# Patient Record
Sex: Male | Born: 1960 | Race: White | Hispanic: No | Marital: Married | Smoking: Former smoker
Health system: Southern US, Community
[De-identification: ages and names within clinical notes are randomized; demographics above are authoritative.]

## PROBLEM LIST (undated history)

## (undated) DIAGNOSIS — F419 Anxiety disorder, unspecified: Secondary | ICD-10-CM

## (undated) DIAGNOSIS — E119 Type 2 diabetes mellitus without complications: Secondary | ICD-10-CM

## (undated) DIAGNOSIS — G4730 Sleep apnea, unspecified: Secondary | ICD-10-CM

## (undated) DIAGNOSIS — F32A Depression, unspecified: Secondary | ICD-10-CM

## (undated) DIAGNOSIS — E7800 Pure hypercholesterolemia, unspecified: Secondary | ICD-10-CM

## (undated) DIAGNOSIS — F329 Major depressive disorder, single episode, unspecified: Secondary | ICD-10-CM

## (undated) DIAGNOSIS — E669 Obesity, unspecified: Secondary | ICD-10-CM

## (undated) HISTORY — DX: Anxiety disorder, unspecified: F41.9

## (undated) HISTORY — DX: Type 2 diabetes mellitus without complications: E11.9

## (undated) HISTORY — DX: Obesity, unspecified: E66.9

## (undated) HISTORY — DX: Depression, unspecified: F32.A

## (undated) HISTORY — DX: Pure hypercholesterolemia, unspecified: E78.00

## (undated) HISTORY — DX: Major depressive disorder, single episode, unspecified: F32.9

## (undated) HISTORY — PX: NOSE SURGERY: SHX723

## (undated) HISTORY — DX: Sleep apnea, unspecified: G47.30

---

## 1999-02-04 ENCOUNTER — Ambulatory Visit: Admission: RE | Admit: 1999-02-04 | Discharge: 1999-02-04 | Payer: Self-pay | Admitting: *Deleted

## 1999-05-05 ENCOUNTER — Encounter: Payer: Self-pay | Admitting: *Deleted

## 1999-05-06 ENCOUNTER — Ambulatory Visit (HOSPITAL_COMMUNITY): Admission: RE | Admit: 1999-05-06 | Discharge: 1999-05-07 | Payer: Self-pay | Admitting: *Deleted

## 2002-06-11 ENCOUNTER — Ambulatory Visit (HOSPITAL_COMMUNITY): Admission: RE | Admit: 2002-06-11 | Discharge: 2002-06-11 | Payer: Self-pay | Admitting: Nephrology

## 2002-06-11 ENCOUNTER — Encounter: Payer: Self-pay | Admitting: Nephrology

## 2002-06-25 ENCOUNTER — Encounter: Payer: Self-pay | Admitting: Nephrology

## 2002-06-25 ENCOUNTER — Ambulatory Visit (HOSPITAL_COMMUNITY): Admission: RE | Admit: 2002-06-25 | Discharge: 2002-06-25 | Payer: Self-pay | Admitting: Nephrology

## 2003-01-30 ENCOUNTER — Ambulatory Visit (HOSPITAL_BASED_OUTPATIENT_CLINIC_OR_DEPARTMENT_OTHER): Admission: RE | Admit: 2003-01-30 | Discharge: 2003-01-30 | Payer: Self-pay | Admitting: Pulmonary Disease

## 2017-10-02 ENCOUNTER — Encounter: Payer: Self-pay | Admitting: Gastroenterology

## 2017-10-08 ENCOUNTER — Ambulatory Visit (INDEPENDENT_AMBULATORY_CARE_PROVIDER_SITE_OTHER): Payer: Federal, State, Local not specified - PPO | Admitting: Gastroenterology

## 2017-10-08 ENCOUNTER — Encounter: Payer: Self-pay | Admitting: Gastroenterology

## 2017-10-08 ENCOUNTER — Other Ambulatory Visit (INDEPENDENT_AMBULATORY_CARE_PROVIDER_SITE_OTHER): Payer: Federal, State, Local not specified - PPO

## 2017-10-08 ENCOUNTER — Ambulatory Visit (HOSPITAL_BASED_OUTPATIENT_CLINIC_OR_DEPARTMENT_OTHER)
Admission: RE | Admit: 2017-10-08 | Discharge: 2017-10-08 | Disposition: A | Discharge status: Home / Self Care | Payer: Federal, State, Local not specified - PPO | Source: Ambulatory Visit | Attending: Gastroenterology | Admitting: Gastroenterology

## 2017-10-08 VITALS — BP 158/80 | HR 83 | Ht 72.0 in | Wt 264.1 lb

## 2017-10-08 DIAGNOSIS — Z1212 Encounter for screening for malignant neoplasm of rectum: Secondary | ICD-10-CM

## 2017-10-08 DIAGNOSIS — Z1211 Encounter for screening for malignant neoplasm of colon: Secondary | ICD-10-CM

## 2017-10-08 DIAGNOSIS — E118 Type 2 diabetes mellitus with unspecified complications: Secondary | ICD-10-CM | POA: Diagnosis present

## 2017-10-08 DIAGNOSIS — R160 Hepatomegaly, not elsewhere classified: Secondary | ICD-10-CM

## 2017-10-08 DIAGNOSIS — K760 Fatty (change of) liver, not elsewhere classified: Secondary | ICD-10-CM | POA: Diagnosis not present

## 2017-10-08 DIAGNOSIS — K7689 Other specified diseases of liver: Secondary | ICD-10-CM

## 2017-10-08 NOTE — Progress Notes (Signed)
Chief Complaint: Hepatomegaly, right sided abdominal pain  Referring Provider:  Kathrin Ruddy, PA-C    HPI:     Kenneth Rollins is a 57 y.o. male referred to the [facility] for evaluation of abnormal liver on recent imaging study.   CT in July at [place] notable for 1.6 cm right hepatic lobe cyst, moderate steatosis, hepatomegaly at 22.8 cm without masses or duct dilatation. Normal appearing pancreas and spleen. Additionally with a 10 mm perinephric nodule, with recommendation for follow-up CT in 6 months.   CT Chest in May 2019 notable for decreased hepatic attenuation and 1.6 cm cyst and a reactive 2 cm LN in the hepatoduodenal ligament  Eval at [place] also notable for normal WBC, Hgb/Hct and PLTs, negative, HBV, HCV, HAV, lipase, CMP (aside from BG 424) A1c 11.8 2017: Normal LAEs 2018: Normal LAEs   Today, he is c/o right sided abdominal pain and feels swelling on the right side and now increased pain and feeling of swelling on the LUQ as well. Pain increases within an hour of starting work/route.  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  He endorses some S OB but attributes this to right sided discomfort and perceived swelling.  He otherwise has no hx of known liver or biliary disease, and no jaundice, icteric sclera, asictes, confusion. No known family history of CRC, GI malignancy, liver disease, pancreatic disease, or IBD.  He does not drink alcohol.  Known history of previously elevated liver enzymes.  He was last seen by his PCM in May 2019 but admits to not following up regularly. At that time, was ordered for labs and CT with close f/u owing to his poorly controlled DM. He was prescribed Toujeo (he is unclear today about how often he is actually taking  insulin), and has not follow-up yet. He reports having home BG levels >700 this year, but does not report this to [facility] or go to hospital.   Past Medical History:  Diagnosis Date  . Anxiety   . Depression   . Diabetes ([place])   . High blood cholesterol   . Obesity   . Sleep apnea      Past Surgical History:  Procedure Laterality Date  . NOSE SURGERY     Family History  Problem Relation Age of Onset  . Diabetes Father   . Heart disease Father    Social History   Tobacco Use  . Smoking status: Former Research scientist (life sciences)  . Smokeless tobacco: Never Used  Substance Use Topics  . Alcohol use: Not Currently  . Drug use: Not Currently   Current Outpatient Medications  Medication Sig Dispense Refill  . amphetamine-dextroamphetamine (ADDERALL) 20 MG tablet Take 1 tablet by mouth 3 (three) times daily.    . clonazePAM (KLONOPIN) 1 MG tablet Take 1 tablet by mouth 2 (two) times daily.    . divalproex (DEPAKOTE ER) 500 MG 24 hr tablet Take 1 tablet by mouth daily.    Marland Kitchen escitalopram (LEXAPRO) 20 MG tablet Take 1 tablet by mouth 2 (two) times daily.    . insulin lispro (HUMALOG) 100 UNIT/ML KiwkPen Inject 100 mLs into the skin daily.    . Insulin Pen Needle (EXEL COMFORT POINT PEN  NEEDLE) 31G X 6 MM MISC 1 each by Misc.(Non-Drug; Combo Route) route 3 times daily.    Marland Kitchen lisinopril (PRINIVIL,ZESTRIL) 10 MG tablet Take 1 tablet by mouth daily.    . QUEtiapine (SEROQUEL) 100 MG tablet Take 1 tablet by mouth daily.  1   No current facility-administered medications for this visit.    Allergies  Allergen Reactions  . Metformin     Diarrhea     Review of Systems: All systems reviewed and negative except where noted in HPI.     Physical Exam:    Wt Readings from Last 3 Encounters:  10/08/17 264 lb 2 oz (119.8 kg)    BP (!) 158/80   Pulse 83   Ht 6' (1.829 m)   Wt 264 lb 2 oz (119.8 kg)   BMI 35.82 kg/m  Constitutional:  Pleasant, in no acute distress. Psychiatric: Normal mood and affect.  Behavior is normal. EENT: Pupils normal.  Conjunctivae are normal. No scleral icterus. Neck supple. No cervical LAD. Cardiovascular: Normal rate, regular rhythm. No edema Pulmonary/chest: Effort normal and breath sounds normal. No wheezing, rales or rhonchi. Abdominal: Soft, nondistended, nontender. Bowel sounds active throughout. There are no masses palpable. No hepatomegaly. Neurological: Alert and oriented to person place and time. Skin: Skin is warm and dry. No rashes noted.   ASSESSMENT AND PLAN;    1) Hepatomegaly: Hepatomegaly noted on recent CT of 22.8 cm.  The location of his pain and CT findings are certainly indicative of pain secondary to hepatomegaly.  We discussed the underlying etiologies for hepatomegaly, and his particular case (poorly controlled diabetes, obesity, poor diet) certainly seems consistent with fatty liver disease.  Interestingly he has had normal liver enzymes since 7341, although in certain situations this may actually portend a poor prognosis with already-progression from NAFLD to NASH.  Otherwise, normal albumin and normal platelets suggestive of preserved hepatic synthetic function, without any coag panel to date.   We did very long and frank discussion regarding his uncontrolled diabetes (recent A1c greater than 11) and the very real risk that he is already progressed from NAFLD to NASH, particularly with his normal enzymes.  Additional etiologies for hepatomegaly include viral hepatitis (HBV and HCV negative), autoimmune hepatitis, Wilson disease, hemochromatosis, alpha-1 antitrypsin deficiency, hepatic vein thrombosis, cardiac and will plan on extended evaluation as below.  - RUQ [place] with Doppler - INR check - AMA, ASMA, ANA - Consider liver bx to eval for NASH -If above eval is unrevealing, will do extended serologic evaluation to rule out other potential concomitant disease - Given the presumed diagnosis of fatty liver disease, I extensively counseled the  patient on the importance of diet and exercise, with a modest weight loss of 10% of total body weight, done slowly over weeks to months.  To be done in conjunction with diabetes management as outlined below - Pioglitazone (but not Metformin) has been efficacious in the treatment of NASH patient, although the majority of patients who received benefit from TZDs in clinical trials were non-diabetic, and long-term efficacy is not well established to date, and risks include cardiovascular disease, CHF, bladder cancer and bone loss.  - Vitamin E 800 IU/day improves liver histology in non-diabetic NASH patients, and can be considered as first line monotherapy in this population. However, due to the noted side effects and reduced studies on certain populations, this is generally not recommended in patients with DM, NASH cirrhosis, or cryptogenic cirrhosis    2) Hepatic cyst: CT with 1.6 cm  cyst in right hepatic lobe.  Also seen on previous CT from May 2019.  Appearance seems consistent with simple hepatic cyst, but given size greater than 1 cm will elect to survey for size stability in the future.  - RUQ [place] as above can also help to characterize size and etiology for cyst.  Can then plan on repeat RUQ [place] in 6 to 12 months to ensure size stability - Reassured patient that the small size cyst is not the etiology for his pain and no need for surgical intervention for this lesion at this time  3) CRC screening: No previous CRC screening.  We discussed colon cancer to include screening modalities such as optical colonoscopy, FIT kit testing, Cologuard, and he decided to proceed with colonoscopy.  The indications, risks, and benefits of colonoscopy were explained to the patient in detail. Risks include but are not limited to bleeding, perforation, adverse reaction to medications, and cardiopulmonary compromise. Sequelae include but are not limited to the possibility of surgery, hospitalization, and mortality. The  patient verbalized understanding and wished to proceed. All questions answered, referred to the scheduler and bowel prep ordered. Further recommendations pending results of the exam.   4) Diabetes:  Long history of poorly controlled diabetes with recent hemoglobin A1c of 11.8 He is unclear/vague today about how often he is actually even taking his insulin.  We had a very long and frank discussion regarding the risks of his poorly controlled diabetes, to include stroke, heart attack, PVD, neuropathy, renal disease.  I explained, in no uncertain terms, that without adequately treating his diabetes, that he will undoubtedly have continued clinical decline and eventual organ failure.  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  I gave a strong recommendation for him to call his [facility] office today and schedule a short interval follow-up for continued diabetes management.    Lavena Bullion, DO, FACG  10/08/2017, 8:48 AM   Cc: Kathrin Ruddy, PA-C Myrtis Hopping., MD

## 2017-10-08 NOTE — Patient Instructions (Addendum)
If you are age 58 or older, your body mass index should be between 23-30. Your Body mass index is 35.82 kg/m. If this is out of the aforementioned range listed, please consider follow up with your Primary Care Provider.  If you are age 58 or younger, your body mass index should be between 19-25. Your Body mass index is 35.82 kg/m. If this is out of the aformentioned range listed, please consider follow up with your Primary Care Provider.   You have been [facility]1st floor [address] ) on 10/08/2017 at 3:30pm. Please arrive 15 minutes prior to your appointment for registration. Make certain not to have anything to eat or drink 6 hours prior to your appointment. Should you need to reschedule your appointment, please contact radiology at [phone]. This test typically takes about 30 minutes to perform.  Please call us back at [phone] to schedule your Colonoscopy when you find an available date.  Please go to the lab on the 2nd floor [address] before you leave the office today.    It was a pleasure to see you today!  Vito Cirigliano, D.O.

## 2017-10-09 LAB — PROTIME-INR
INR: 1.1 ratio — ABNORMAL HIGH (ref 0.8–1.0)
Prothrombin Time: 12.8 s (ref 9.6–13.1)

## 2017-10-10 ENCOUNTER — Encounter: Payer: Self-pay | Admitting: Gastroenterology

## 2017-10-10 LAB — ANTI-SMOOTH MUSCLE ANTIBODY, IGG: Actin (Smooth Muscle) Antibody (IGG): <20 U (ref <20)

## 2017-10-10 LAB — ANA: Anti Nuclear Antibody(ANA): NEGATIVE

## 2017-10-10 LAB — MITOCHONDRIAL ANTIBODIES: Mitochondrial M2 Ab, IgG: <=20 U

## 2017-10-11 ENCOUNTER — Other Ambulatory Visit: Payer: Self-pay

## 2017-10-11 DIAGNOSIS — R935 Abnormal findings on diagnostic imaging of other abdominal regions, including retroperitoneum: Secondary | ICD-10-CM

## 2017-10-11 DIAGNOSIS — R945 Abnormal results of liver function studies: Secondary | ICD-10-CM

## 2017-10-11 DIAGNOSIS — K760 Fatty (change of) liver, not elsewhere classified: Secondary | ICD-10-CM

## 2017-10-11 DIAGNOSIS — K7689 Other specified diseases of liver: Secondary | ICD-10-CM

## 2017-10-11 DIAGNOSIS — R160 Hepatomegaly, not elsewhere classified: Secondary | ICD-10-CM

## 2017-10-11 DIAGNOSIS — R7989 Other specified abnormal findings of blood chemistry: Secondary | ICD-10-CM

## 2018-02-13 ENCOUNTER — Other Ambulatory Visit: Payer: Self-pay

## 2018-02-13 ENCOUNTER — Encounter (HOSPITAL_BASED_OUTPATIENT_CLINIC_OR_DEPARTMENT_OTHER): Payer: Self-pay | Admitting: Emergency Medicine

## 2018-02-13 ENCOUNTER — Emergency Department (HOSPITAL_BASED_OUTPATIENT_CLINIC_OR_DEPARTMENT_OTHER)
Admission: EM | Admit: 2018-02-13 | Discharge: 2018-02-13 | Disposition: A | Discharge status: Home / Self Care | Payer: Federal, State, Local not specified - PPO | Attending: Emergency Medicine | Admitting: Emergency Medicine

## 2018-02-13 DIAGNOSIS — K0889 Other specified disorders of teeth and supporting structures: Secondary | ICD-10-CM | POA: Insufficient documentation

## 2018-02-13 DIAGNOSIS — Z87891 Personal history of nicotine dependence: Secondary | ICD-10-CM | POA: Insufficient documentation

## 2018-02-13 DIAGNOSIS — F329 Major depressive disorder, single episode, unspecified: Secondary | ICD-10-CM | POA: Insufficient documentation

## 2018-02-13 DIAGNOSIS — R220 Localized swelling, mass and lump, head: Secondary | ICD-10-CM | POA: Insufficient documentation

## 2018-02-13 DIAGNOSIS — Z79899 Other long term (current) drug therapy: Secondary | ICD-10-CM | POA: Insufficient documentation

## 2018-02-13 DIAGNOSIS — E119 Type 2 diabetes mellitus without complications: Secondary | ICD-10-CM | POA: Diagnosis not present

## 2018-02-13 DIAGNOSIS — F419 Anxiety disorder, unspecified: Secondary | ICD-10-CM | POA: Insufficient documentation

## 2018-02-13 DIAGNOSIS — Z794 Long term (current) use of insulin: Secondary | ICD-10-CM | POA: Insufficient documentation

## 2018-02-13 LAB — BASIC METABOLIC PANEL
Anion gap: 8 (ref 5–15)
BUN: 11 mg/dL (ref 6–20)
CO2: 24 mmol/L (ref 22–32)
Calcium: 9.1 mg/dL (ref 8.9–10.3)
Chloride: 98 mmol/L (ref 98–111)
Creatinine, Ser: 0.74 mg/dL (ref 0.61–1.24)
GFR calc Af Amer: >60 mL/min (ref >60)
GFR calc non Af Amer: >60 mL/min (ref >60)
Glucose, Bld: 461 mg/dL — ABNORMAL HIGH (ref 70–99)
Potassium: 4.3 mmol/L (ref 3.5–5.1)
Sodium: 130 mmol/L — ABNORMAL LOW (ref 135–145)

## 2018-02-13 LAB — CBC WITH DIFFERENTIAL/PLATELET
Abs Immature Granulocytes: 0.05 10*3/uL (ref 0.00–0.07)
Basophils Absolute: 0.1 10*3/uL (ref 0.0–0.1)
Basophils Relative: 0 %
Eosinophils Absolute: 0.1 10*3/uL (ref 0.0–0.5)
Eosinophils Relative: 1 %
HCT: 42 % (ref 39.0–52.0)
Hemoglobin: 13.7 g/dL (ref 13.0–17.0)
Immature Granulocytes: 0 %
Lymphocytes Relative: 12 %
Lymphs Abs: 1.9 10*3/uL (ref 0.7–4.0)
MCH: 30 pg (ref 26.0–34.0)
MCHC: 32.6 g/dL (ref 30.0–36.0)
MCV: 92.1 fL (ref 80.0–100.0)
Monocytes Absolute: 1.3 10*3/uL — ABNORMAL HIGH (ref 0.1–1.0)
Monocytes Relative: 8 %
Neutro Abs: 13.2 10*3/uL — ABNORMAL HIGH (ref 1.7–7.7)
Neutrophils Relative %: 79 %
Platelets: 333 10*3/uL (ref 150–400)
RBC: 4.56 MIL/uL (ref 4.22–5.81)
RDW: 11.9 % (ref 11.5–15.5)
WBC: 16.5 10*3/uL — ABNORMAL HIGH (ref 4.0–10.5)
nRBC: 0 % (ref 0.0–0.2)

## 2018-02-13 LAB — CBG MONITORING, ED
GLUCOSE-CAPILLARY: 414 mg/dL — AB (ref 70–99)
Glucose-Capillary: 283 mg/dL — ABNORMAL HIGH (ref 70–99)

## 2018-02-13 MED ORDER — SODIUM CHLORIDE 0.9 % IV BOLUS
1000.0000 mL | Freq: Once | INTRAVENOUS | Status: AC
  Administered 2018-02-13: 1000 mL via INTRAVENOUS

## 2018-02-13 MED ORDER — OXYCODONE-ACETAMINOPHEN 5-325 MG PO TABS
1.0000 | ORAL_TABLET | Freq: Once | ORAL | Status: AC
  Administered 2018-02-13: 1 via ORAL
  Filled 2018-02-13: qty 1

## 2018-02-13 MED ORDER — OXYCODONE-ACETAMINOPHEN 5-325 MG PO TABS: 1.0000 | ORAL_TABLET | Freq: Four times a day (QID) | ORAL | 0 refills | Status: DC | PRN

## 2018-02-13 MED ORDER — INSULIN REGULAR HUMAN 100 UNIT/ML IJ SOLN
12.0000 [IU] | Freq: Once | INTRAMUSCULAR | Status: AC
  Administered 2018-02-13: 12 [IU] via SUBCUTANEOUS
  Filled 2018-02-13: qty 1

## 2018-02-13 MED ORDER — CLINDAMYCIN PHOSPHATE 600 MG/50ML IV SOLN: 600.0000 mg | Freq: Once | INTRAVENOUS | Status: DC

## 2018-02-13 MED ORDER — SODIUM CHLORIDE 0.9 % IV SOLN
INTRAVENOUS | Status: DC | PRN
  Administered 2018-02-13: 250 mL via INTRAVENOUS

## 2018-02-13 MED ORDER — CLINDAMYCIN PHOSPHATE 900 MG/50ML IV SOLN
900.0000 mg | Freq: Once | INTRAVENOUS | Status: AC
  Administered 2018-02-13: 900 mg via INTRAVENOUS
  Filled 2018-02-13: qty 50

## 2018-02-13 MED ORDER — CLINDAMYCIN HCL 150 MG PO CAPS: 450.0000 mg | ORAL_CAPSULE | Freq: Three times a day (TID) | ORAL | 0 refills | Status: AC

## 2018-02-13 NOTE — ED Notes (Signed)
Pt c/o Lower left side dental pain x 2 days. Pt was prescribed amoxicillin and tramadol but states his pain has increased. Pt states he can feel the teeth moving and "thinks the teeth are broken below gum". Lwer jaw appears swollen, no pain when palpating jaw.

## 2018-02-13 NOTE — ED Notes (Signed)
ED Provider at bedside. 

## 2018-02-13 NOTE — ED Provider Notes (Signed)
Patient with dental pain onset 2 days ago and jaw swelling at left jaw which started yesterday.  He has been treated with amoxicillin, and tramadol.  No fever.  No other associated symptoms.  He reports jaw swelling is worse today.  On exam he is alert nontoxic handling secretions well.  HEENT exam left jaw swollen and tender.  He is tender over teeth numbers 21, 22 and 23.  No fluctuance or swelling of gingiva.  No trismus.  Submandibular area is not swollen, minimally tender.   Kenneth SouJacubowitz, Kenneth Perren, MD 02/13/18 1058

## 2018-02-13 NOTE — ED Notes (Signed)
Pt given diet coke. 

## 2018-02-13 NOTE — ED Triage Notes (Signed)
Facial swelling from tooth issues.  Has been taking Amoxicillin since yesterday from dentist but the swelling is getting worse.  Dentist sent to Ed for IV abx.

## 2018-02-13 NOTE — ED Notes (Addendum)
Per wife pt took 4 x extra strength tylenol prior to arriving at 1000am today, and took tramadol at 0745 today.

## 2018-02-13 NOTE — ED Provider Notes (Signed)
[facility] EMERGENCY DEPARTMENT Provider Note   CSN: [id number] Arrival date & time: 02/13/18  1009     History   Chief Complaint Chief Complaint  Patient presents with  . Facial Swelling    HPI Kenneth Rollins is a 57 y.o. male with history of obesity, diabetes, anxiety, depression, sleep apnea who presents with facial swelling that has been progressively worsening over the past few days.  He has had associated dental pain.  His dentist called him in amoxicillin yesterday, however he had taken a couple days worth prior to that.  He called his dentist today and they sent him here for IV antibiotics and he was told that oral antibiotics would not work for him.  He denies any fevers, chest pain, shortness of breath, abdominal pain, nausea, vomiting.  HPI  Past Medical History:  Diagnosis Date  . Anxiety   . Depression   . Diabetes ([facility])   . High blood cholesterol   . Obesity   . Sleep apnea     There are no active problems to display for this patient.   Past Surgical History:  Procedure Laterality Date  . NOSE SURGERY          Home Medications    Prior to Admission medications   Medication Sig Start Date End Date Taking? Authorizing Provider  DULoxetine (CYMBALTA) 60 MG capsule Take by mouth. 01/07/18  Yes [provider]  lamoTRIgine (LAMICTAL) 25 MG tablet Take 25 mg (1 tab) daily for 2 weeks ; then 50 mg (2 tabs) daily for 2 weeks 01/07/18  Yes [provider]  amphetamine-dextroamphetamine (ADDERALL) 20 MG tablet Take 1 tablet by mouth 3 (three) times daily. 10/18/16   [provider]  clindamycin (CLEOCIN) 150 MG capsule Take 3 capsules (450 mg total) by mouth 3 (three) times daily. 02/13/18   Lyann Hagstrom, Waylan BogaAlexandra M, PA-C  clonazePAM (KLONOPIN) 1 MG tablet Take 1 tablet by mouth 2 (two) times daily. 07/02/17   [provider]  insulin lispro (HUMALOG) 100 UNIT/ML KiwkPen Inject 100 mLs into the skin daily. 08/23/15    [provider]  Insulin Pen Needle (EXEL COMFORT POINT PEN NEEDLE) 31G X 6 MM MISC 1 each by Misc.(Non-Drug; Combo Route) route 3 times daily. 01/26/17   [provider]  lisinopril (PRINIVIL,ZESTRIL) 10 MG tablet Take 1 tablet by mouth daily. 09/24/17   [provider]  oxyCODONE-acetaminophen (PERCOCET/ROXICET) 5-325 MG tablet Take 1-2 tablets by mouth every 6 (six) hours as needed for severe pain. 02/13/18   Oluwadara Gorman, Waylan BogaAlexandra M, PA-C  QUEtiapine (SEROQUEL) 100 MG tablet Take 1 tablet by mouth daily. 07/16/17   [provider]    Family History Family History  Problem Relation Age of Onset  . Diabetes Father   . Heart disease Father     Social History Social History   Tobacco Use  . Smoking status: Former Games developermoker  . Smokeless tobacco: Never Used  Substance Use Topics  . Alcohol use: Not Currently  . Drug use: Not Currently     Allergies   Metformin   Review of Systems Review of Systems  Constitutional: Negative for chills and fever.  HENT: Positive for dental problem and facial swelling. Negative for sore throat.   Respiratory: Negative for shortness of breath.   Cardiovascular: Negative for chest pain.  Gastrointestinal: Negative for abdominal pain, nausea and vomiting.  Genitourinary: Negative for dysuria.  Musculoskeletal: Negative for back pain.  Skin: Negative for rash and wound.  Neurological: Negative for headaches.  Psychiatric/Behavioral: The patient is not nervous/anxious.      Physical Exam Updated Vital Signs BP (!) 167/94 (BP Location: Right Arm)   Pulse 93   Temp 98.4 F (36.9 C) (Oral)   Resp 20   Ht 6' (1.829 m)   Wt 122.5 kg   SpO2 95%   BMI 36.62 kg/m   Physical Exam Vitals signs and nursing note reviewed.  Constitutional:      General: He is not in acute distress.    Appearance: He is well-developed. He is not diaphoretic.  HENT:     Head: Normocephalic and atraumatic.     Mouth/Throat:     Pharynx: No  oropharyngeal exudate or posterior oropharyngeal erythema.     Tonsils: No tonsillar exudate or tonsillar abscesses.      Comments: No trismus Eyes:     General: No scleral icterus.       Right eye: No discharge.        Left eye: No discharge.     Conjunctiva/sclera: Conjunctivae normal.     Pupils: Pupils are equal, round, and reactive to light.  Neck:     Musculoskeletal: Normal range of motion and neck supple. Edema present.     Thyroid: No thyromegaly.     Comments: Edema noted to the submandibular space throughout the anterior neck with tenderness Cardiovascular:     Rate and Rhythm: Normal rate and regular rhythm.     Heart sounds: Normal heart sounds. No murmur. No friction rub. No gallop.   Pulmonary:     Effort: Pulmonary effort is normal. No respiratory distress.     Breath sounds: Normal breath sounds. No stridor. No wheezing or rales.  Abdominal:     General: Bowel sounds are normal. There is no distension.     Palpations: Abdomen is soft.     Tenderness: There is no abdominal tenderness. There is no guarding or rebound.  Lymphadenopathy:     Cervical: No cervical adenopathy.  Skin:    General: Skin is warm and dry.     Coloration: Skin is not pale.     Findings: No rash.  Neurological:     Mental Status: He is alert.     Coordination: Coordination normal.      ED Treatments / Results  Labs (all labs ordered are listed, but only abnormal results are displayed) Labs Reviewed  BASIC METABOLIC PANEL - Abnormal; Notable for the following components:      Result Value   Sodium 130 (*)    Glucose, Bld 461 (*)    All other components within normal limits  CBC WITH DIFFERENTIAL/PLATELET - Abnormal; Notable for the following components:   WBC 16.5 (*)    Neutro Abs 13.2 (*)    Monocytes Absolute 1.3 (*)    All other components within normal limits  CBG MONITORING, ED - Abnormal; Notable for the following components:   Glucose-Capillary 414 (*)    All other  components within normal limits  CBG MONITORING, ED - Abnormal; Notable for the following components:   Glucose-Capillary 283 (*)    All other components within normal limits    EKG None  Radiology No results found.  Procedures Procedures (including critical care time)  Medications Ordered in ED Medications  0.9 %  sodium chloride infusion ( Intravenous Stopped 02/13/18 1201)  clindamycin (CLEOCIN) IVPB 900 mg (0 mg Intravenous Stopped 02/13/18 1128)  sodium chloride 0.9 % bolus 1,000 mL (0 mLs Intravenous  Stopped 02/13/18 1320)  sodium chloride 0.9 % bolus 1,000 mL (0 mLs Intravenous Stopped 02/13/18 1458)  insulin regular (NOVOLIN R,HUMULIN R) 100 units/mL injection 12 Units (12 Units Subcutaneous Given 02/13/18 1233)  oxyCODONE-acetaminophen (PERCOCET/ROXICET) 5-325 MG per tablet 1 tablet (1 tablet Oral Given 02/13/18 1232)     Initial Impression / Assessment and Plan / ED Course  I have reviewed the triage vital signs and the nursing notes.  Pertinent labs & imaging results that were available during my care of the patient were reviewed by me and considered in my medical decision making (see chart for details).     Patient presenting with facial swelling associated with dental pain.  There is no drainable abscess visible.  Low suspicion for Ludwick's angina.  Patient is well-appearing.  His voice is normal.  There is no trismus.  Patient is afebrile.  CBC does show leukocytosis of 16.5.  He is hyperglycemic, however he did not take his insulin today.  Patient given 12 units of regular insulin and 2 L of fluid which decreased his glucose to 283.  No signs of DKA or HHS at this time.  Patient given IV clindamycin dose in the ED.  We will switch amoxicillin to clindamycin.  Patient not having any control of pain with tramadol given by dentist.  Will discharge home with a few Percocet to get him to his appointment tomorrow morning.  I reviewed the [place] narcotic database and found no  discrepancies.  Patient has follow-up with dentist tomorrow.  Return precautions discussed.  Patient understands and agrees with plan.  Patient vital stable throughout ED course and discharged in satisfactory condition.  Patient also evaluated by my attending, Dr. Ethelda Chick, who guided the patient's management and agrees with plan.  Final Clinical Impressions(s) / ED Diagnoses   Final diagnoses:  Facial swelling  Pain, dental    ED Discharge Orders         Ordered    oxyCODONE-acetaminophen (PERCOCET/ROXICET) 5-325 MG tablet  Every 6 hours PRN     02/13/18 1448    clindamycin (CLEOCIN) 150 MG capsule  3 times daily     02/13/18 1448           Emi Holes, PA-C 02/13/18 1545    Doug Sou, MD 02/13/18 1623

## 2018-02-13 NOTE — Discharge Instructions (Signed)
Stop taking amoxicillin.  Take clindamycin until completed.  You can take 1-2 Percocet every 6 hours for your pain.  Stop taking tramadol.  Please follow-up with your dentist as planned tomorrow.  Please return the emergency department he develop any new or worsening symptoms including lockjaw, inability to swallow your own saliva, persistent fever 100.4, or any other concerning symptoms.  Do not drink alcohol, drive, operate machinery or participate in any other potentially dangerous activities while taking opiate pain medication as it may make you sleepy. Do not take this medication with any other sedating medications, either prescription or over-the-counter. If you were prescribed Percocet or Vicodin, do not take these with acetaminophen (Tylenol) as it is already contained within these medications and overdose of Tylenol is dangerous.   This medication is an opiate (or narcotic) pain medication and can be habit forming.  Use it as little as possible to achieve adequate pain control.  Do not use or use it with extreme caution if you have a history of opiate abuse or dependence. This medication is intended for your use only - do not give any to anyone else and keep it in a secure place where nobody else, especially children, have access to it. It will also cause or worsen constipation, so you may want to consider taking an over-the-counter stool softener while you are taking this medication.

## 2018-03-25 ENCOUNTER — Ambulatory Visit (HOSPITAL_BASED_OUTPATIENT_CLINIC_OR_DEPARTMENT_OTHER): Payer: Federal, State, Local not specified - PPO

## 2018-07-26 HISTORY — PX: CHOLECYSTECTOMY: SHX55

## 2018-11-18 ENCOUNTER — Encounter: Payer: Self-pay | Admitting: Gastroenterology

## 2018-11-18 ENCOUNTER — Ambulatory Visit (INDEPENDENT_AMBULATORY_CARE_PROVIDER_SITE_OTHER): Payer: Federal, State, Local not specified - PPO | Admitting: Gastroenterology

## 2018-11-18 ENCOUNTER — Other Ambulatory Visit: Payer: Self-pay

## 2018-11-18 VITALS — BP 148/80 | HR 86 | Temp 97.9°F | Ht 73.0 in | Wt 255.1 lb

## 2018-11-18 DIAGNOSIS — R1031 Right lower quadrant pain: Secondary | ICD-10-CM | POA: Diagnosis not present

## 2018-11-18 DIAGNOSIS — K760 Fatty (change of) liver, not elsewhere classified: Secondary | ICD-10-CM | POA: Diagnosis not present

## 2018-11-18 DIAGNOSIS — K5909 Other constipation: Secondary | ICD-10-CM | POA: Diagnosis not present

## 2018-11-18 NOTE — Progress Notes (Signed)
Chief Complaint:   Referring Provider:  Dan Maker., MD      ASSESSMENT AND PLAN;   #1.  RLQ pain  #2. Chronic constipation.   #3. Fatty liver.   #4.  Biliary dyskinesia S/P lap chole July 27, 2018  Plan: - CT Abdo/pelvis with p.o. and IV contrast. - CBC with diff, CMP, TSH, celiac screen today. - Miralax 17g po qd. - Recommend colon therafter.  Not very keen to get colonoscopy performed.  He has been offered in the past as well. - Encouraged him to continue to reduce weight gradually.  - FU in 6 weeks.   HPI:    Kenneth Rollins is a 58 y.o. 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  Getting worse.  No fever or chills.  The discomfort gets exacerbated with walking.  History of chronic constipation and would have bowel movements at the frequency of once per week.  He has tried multiple medications and has MiraLAX at home.  Does complain of abdominal bloating.  No melena or hematochezia.  Had recent laparoscopic cholecystectomy 07/26/2018.  Has history of fatty liver.  Has been able to reduce weight from 270 Lb Dec 2019 to 255 LB today.  No alcohol.  Wt Readings from Last 3 Encounters:  11/18/18 255 lb 2 oz (115.7 kg)  02/13/18 270 lb (122.5 kg)  10/08/17 264 lb 2 oz (119.8 kg)    Past Medical History:  Diagnosis Date  . Anxiety   . Depression   . Diabetes (HCC)   . High blood cholesterol   . Obesity   . Sleep apnea     Past Surgical History:  Procedure Laterality Date  . CHOLECYSTECTOMY  07/26/2018  . NOSE SURGERY      Family History  Problem Relation Age of Onset  . Diabetes Father   . Heart disease Father   . Colon cancer Neg Hx   . Esophageal cancer Neg Hx     Social History   Tobacco Use  . Smoking status: Former Games developer  . Smokeless tobacco: Never Used  Substance Use Topics  . Alcohol use: Not Currently  . Drug use: Not Currently    Current  Outpatient Medications  Medication Sig Dispense Refill  . amphetamine-dextroamphetamine (ADDERALL) 20 MG tablet Take 1 tablet by mouth 3 (three) times daily.    . clindamycin (CLEOCIN) 150 MG capsule Take 3 capsules (450 mg total) by mouth 3 (three) times daily. 63 capsule 0  . clonazePAM (KLONOPIN) 1 MG tablet Take 1 tablet by mouth 2 (two) times daily.    . DULoxetine (CYMBALTA) 60 MG capsule Take by mouth.    . insulin lispro (HUMALOG) 100 UNIT/ML KiwkPen Inject 100 mLs into the skin daily.    . Insulin Pen Needle (EXEL COMFORT POINT PEN NEEDLE) 31G X 6 MM MISC 1 each by Misc.(Non-Drug; Combo Route) route 3 times daily.    Marland Kitchen lamoTRIgine (LAMICTAL) 25 MG tablet Take 25 mg (1 tab) daily for 2 weeks ; then 50 mg (2 tabs) daily for 2 weeks    . lisinopril (PRINIVIL,ZESTRIL) 10 MG tablet Take 1 tablet by mouth daily.    . QUEtiapine (SEROQUEL) 100 MG tablet Take 1 tablet by mouth daily.  1   No current facility-administered medications for this visit.     Allergies  Allergen Reactions  . Metformin     Diarrhea    Review  of Systems:  Constitutional: Denies fever, chills, diaphoresis, appetite change and fatigue.  HEENT: Denies photophobia, eye pain, redness, hearing loss, ear pain, congestion, sore throat, rhinorrhea, sneezing, mouth sores, neck pain, neck stiffness and tinnitus.   Respiratory: Denies SOB, DOE, cough, chest tightness,  and wheezing.   Cardiovascular: Denies chest pain, palpitations and leg swelling.  Genitourinary: Denies dysuria, urgency, frequency, hematuria, flank pain and difficulty urinating.  Musculoskeletal: Denies myalgias, back pain, joint swelling, arthralgias and gait problem.  Skin: No rash.  Neurological: Denies dizziness, seizures, syncope, weakness, light-headedness, numbness and headaches.  Hematological: Denies adenopathy. Easy bruising, personal or family bleeding history  Psychiatric/Behavioral: No anxiety or depression     Physical Exam:    BP  (!) 148/80   Pulse 86   Temp 97.9 F (36.6 C)   Ht 6\' 1"  (1.854 m)   Wt 255 lb 2 oz (115.7 kg)   BMI 33.66 kg/m  Filed Weights   11/18/18 1458  Weight: 255 lb 2 oz (115.7 kg)   Constitutional:  Well-developed, in no acute distress. Psychiatric: Normal mood and affect. Behavior is normal. HEENT: Pupils normal.  Conjunctivae are normal. No scleral icterus. Neck supple.  Cardiovascular: Normal rate, regular rhythm. No edema Pulmonary/chest: Effort normal and breath sounds normal. No wheezing, rales or rhonchi. Abdominal: Soft, nondistended. Nontender. Bowel sounds active throughout. There are no masses palpable. No hepatomegaly. Rectal:  defered Neurological: Alert and oriented to person place and time. Skin: Skin is warm and dry. No rashes noted.  Data Reviewed: I have personally reviewed following labs and imaging studies  CBC: CBC Latest Ref Rng & Units 02/13/2018  WBC 4.0 - 10.5 K/uL 16.5(H)  Hemoglobin 13.0 - 17.0 g/dL 13.7  Hematocrit 39.0 - 52.0 % 42.0  Platelets 150 - 400 K/uL 333    CMP: CMP Latest Ref Rng & Units 02/13/2018  Glucose 70 - 99 mg/dL 461(H)  BUN 6 - 20 mg/dL 11  Creatinine 0.61 - 1.24 mg/dL 0.74  Sodium 135 - 145 mmol/L 130(L)  Potassium 3.5 - 5.1 mmol/L 4.3  Chloride 98 - 111 mmol/L 98  CO2 22 - 32 mmol/L 24  Calcium 8.9 - 10.3 mg/dL 9.1  No flowsheet data found. CMP Latest Ref Rng & Units 02/13/2018  Glucose 70 - 99 mg/dL 461(H)  BUN 6 - 20 mg/dL 11  Creatinine 0.61 - 1.24 mg/dL 0.74  Sodium 135 - 145 mmol/L 130(L)  Potassium 3.5 - 5.1 mmol/L 4.3  Chloride 98 - 111 mmol/L 98  CO2 22 - 32 mmol/L 24  Calcium 8.9 - 10.3 mg/dL 9.1     Carmell Austria, MD 11/18/2018, 3:14 PM  Cc: Myrtis Hopping., MD

## 2018-11-18 NOTE — Patient Instructions (Addendum)
If you are age 59 or older, your body mass index should be between 23-30. Your Body mass index is 33.66 kg/m. If this is out of the aforementioned range listed, please consider follow up with your Primary Care Provider.  If you are age 26 or younger, your body mass index should be between 19-25. Your Body mass index is 33.66 kg/m. If this is out of the aformentioned range listed, please consider follow up with your Primary Care Provider.   [facility]  [id number] [address]   You are scheduled on 11/25/18 at 1:30pm. You should arrive 15 minutes prior to your appointment time for registration. Please follow the written instructions below on the day of your exam:  WARNING: IF YOU ARE ALLERGIC TO IODINE/X-RAY DYE, PLEASE NOTIFY RADIOLOGY IMMEDIATELY AT [phone]! YOU WILL BE GIVEN A 13 HOUR PREMEDICATION PREP.  1) Do not eat or drink anything after 9:30am (4 hours prior to your test) 2) You have been given 2 bottles of oral contrast to drink. The solution may taste better if refrigerated, but do NOT add ice or any other liquid to this solution. Shake well before drinking.    Drink 1 bottle of contrast @ 11:30am (2 hours prior to your exam)  Drink 1 bottle of contrast @ 12:30pm (1 hour prior to your exam)  You may take any medications as prescribed with a small amount of water, if necessary. If you take any of the following medications: METFORMIN, GLUCOPHAGE, GLUCOVANCE, AVANDAMET, RIOMET, FORTAMET, [place] MET, JANUMET, GLUMETZA or METAGLIP, you MAY be asked to HOLD this medication 48 hours AFTER the exam.  The purpose of you drinking the oral contrast is to aid in the visualization of your intestinal tract. The contrast solution may cause some diarrhea. Depending on your individual set of symptoms, you may also receive an intravenous injection of x-ray contrast/dye. Plan on  being at [facility] for 30 minutes or longer, depending on the type of exam you are having performed.  This test typically takes 30-45 minutes to complete.  If you have any questions regarding your exam or if you need to reschedule, you may call the CT department at [phone] between the hours of 8:00 am and 5:00 pm, Monday-Friday.  ________________________________________________________________________  Please go to the lab at [facility] ([place] You will need to go to level "B", you do not need an appointment for this. Hours available are 7:30 am - 4:30 pm.  You will need to have this blood work completed before your scheduled CT or you will not be able to have it completed.   It has been recommended to you by your physician that you have a(n) Colonoscopy completed. Per your request, we did not schedule the procedure(s) today. Please contact our office at [phone] should you decide to have the procedure completed.    Please purchase the following medications over the counter and take as directed: Miralax 17 grams once daily.   Follow up in 6 weeks.

## 2018-11-20 ENCOUNTER — Other Ambulatory Visit (INDEPENDENT_AMBULATORY_CARE_PROVIDER_SITE_OTHER): Payer: Federal, State, Local not specified - PPO

## 2018-11-20 DIAGNOSIS — K5909 Other constipation: Secondary | ICD-10-CM

## 2018-11-20 DIAGNOSIS — K760 Fatty (change of) liver, not elsewhere classified: Secondary | ICD-10-CM | POA: Diagnosis not present

## 2018-11-20 DIAGNOSIS — R1031 Right lower quadrant pain: Secondary | ICD-10-CM

## 2018-11-20 LAB — COMPREHENSIVE METABOLIC PANEL
ALT: 17 U/L (ref 0–53)
AST: 13 U/L (ref 0–37)
Albumin: 4.5 g/dL (ref 3.5–5.2)
Alkaline Phosphatase: 110 U/L (ref 39–117)
BUN: 21 mg/dL (ref 6–23)
CO2: 28 mEq/L (ref 19–32)
Calcium: 10.6 mg/dL — ABNORMAL HIGH (ref 8.4–10.5)
Chloride: 102 mEq/L (ref 96–112)
Creatinine, Ser: 0.83 mg/dL (ref 0.40–1.50)
GFR: 94.87 mL/min (ref >60.00)
Glucose, Bld: 156 mg/dL — ABNORMAL HIGH (ref 70–99)
Potassium: 4.5 mEq/L (ref 3.5–5.1)
Sodium: 141 mEq/L (ref 135–145)
Total Bilirubin: 0.5 mg/dL (ref 0.2–1.2)
Total Protein: 7.7 g/dL (ref 6.0–8.3)

## 2018-11-20 LAB — CBC WITH DIFFERENTIAL/PLATELET
Basophils Absolute: 0.1 10*3/uL (ref 0.0–0.1)
Basophils Relative: 1 % (ref 0.0–3.0)
Eosinophils Absolute: 0.2 10*3/uL (ref 0.0–0.7)
Eosinophils Relative: 1.3 % (ref 0.0–5.0)
HCT: 39.3 % (ref 39.0–52.0)
Hemoglobin: 13 g/dL (ref 13.0–17.0)
Lymphocytes Relative: 28.6 % (ref 12.0–46.0)
Lymphs Abs: 3.7 10*3/uL (ref 0.7–4.0)
MCHC: 33 g/dL (ref 30.0–36.0)
MCV: 93 fl (ref 78.0–100.0)
Monocytes Absolute: 1 10*3/uL (ref 0.1–1.0)
Monocytes Relative: 7.4 % (ref 3.0–12.0)
Neutro Abs: 8 10*3/uL — ABNORMAL HIGH (ref 1.4–7.7)
Neutrophils Relative %: 61.7 % (ref 43.0–77.0)
Platelets: 484 10*3/uL — ABNORMAL HIGH (ref 150.0–400.0)
RBC: 4.22 Mil/uL (ref 4.22–5.81)
RDW: 13.1 % (ref 11.5–15.5)
WBC: 12.9 10*3/uL — ABNORMAL HIGH (ref 4.0–10.5)

## 2018-11-20 LAB — TSH: TSH: 0.99 u[IU]/mL (ref 0.35–4.50)

## 2018-11-22 LAB — CELIAC PANEL 10
Antigliadin Abs, IgA: 6 units (ref 0–19)
Endomysial IgA: NEGATIVE
Gliadin IgG: 9 units (ref 0–19)
IgA/Immunoglobulin A, Serum: 472 mg/dL — ABNORMAL HIGH (ref 90–386)
Tissue Transglut Ab: 2 U/mL (ref 0–5)
Transglutaminase IgA: <2 U/mL (ref 0–3)

## 2018-11-25 ENCOUNTER — Ambulatory Visit (HOSPITAL_BASED_OUTPATIENT_CLINIC_OR_DEPARTMENT_OTHER)
Admission: RE | Admit: 2018-11-25 | Discharge: 2018-11-25 | Disposition: A | Discharge status: Home / Self Care | Payer: Federal, State, Local not specified - PPO | Source: Ambulatory Visit | Attending: Gastroenterology | Admitting: Gastroenterology

## 2018-11-25 ENCOUNTER — Other Ambulatory Visit: Payer: Self-pay

## 2018-11-25 DIAGNOSIS — R1031 Right lower quadrant pain: Secondary | ICD-10-CM | POA: Diagnosis not present

## 2018-11-25 DIAGNOSIS — K760 Fatty (change of) liver, not elsewhere classified: Secondary | ICD-10-CM | POA: Insufficient documentation

## 2018-11-25 DIAGNOSIS — K5909 Other constipation: Secondary | ICD-10-CM | POA: Diagnosis present

## 2018-11-25 MED ORDER — IOHEXOL 300 MG/ML  SOLN
100.0000 mL | Freq: Once | INTRAMUSCULAR | Status: AC | PRN
  Administered 2018-11-25: 100 mL via INTRAVENOUS

## 2018-11-28 ENCOUNTER — Telehealth: Payer: Self-pay | Admitting: Gastroenterology

## 2018-11-28 NOTE — Telephone Encounter (Signed)
Please review CT results and advise

## 2020-07-20 IMAGING — CT CT ABD-PELV W/ CM
2 of 5 series · 16 of 46 positions shown, 18 images · IV contrast (APPLIED)
Comparison: Abdominal ultrasound 10/08/2017 and CT abdomen pelvis
09/10/2017.

CLINICAL DATA: Right-sided pain and tenderness.

EXAM:
CT ABDOMEN AND PELVIS WITH CONTRAST
TECHNIQUE: Multidetector CT imaging of the abdomen and pelvis was performed
using the standard protocol following bolus administration of
intravenous contrast.
CONTRAST:  100mL OMNIPAQUE IOHEXOL 300 MG/ML  SOLN

[Series 2: axial st · axial · 0.95mm/px · z∈[-702,-177]mm · 13 of 119 slices shown, 15 images]
[im 7/119  soft-tissue]
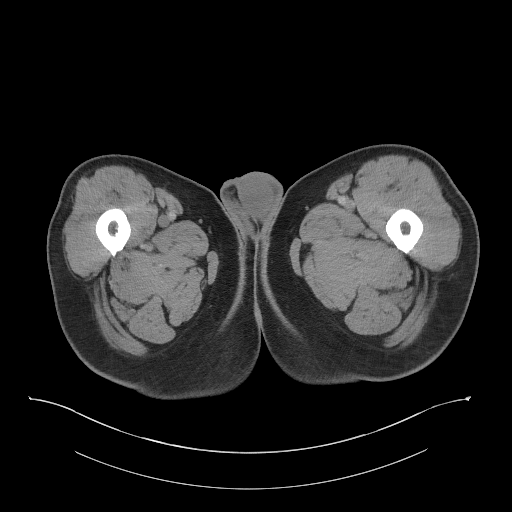
[im 7/119  bone]
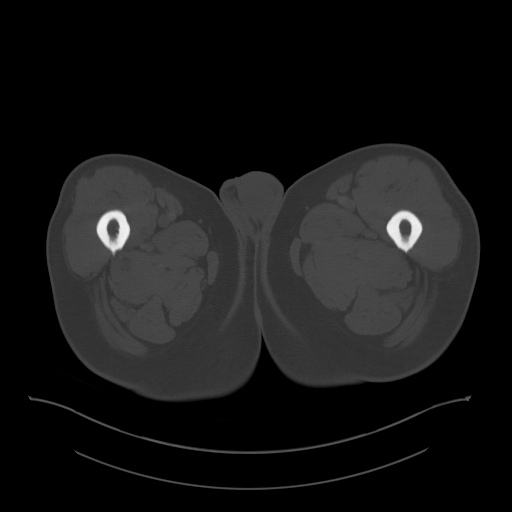
[im 19/119  soft-tissue]
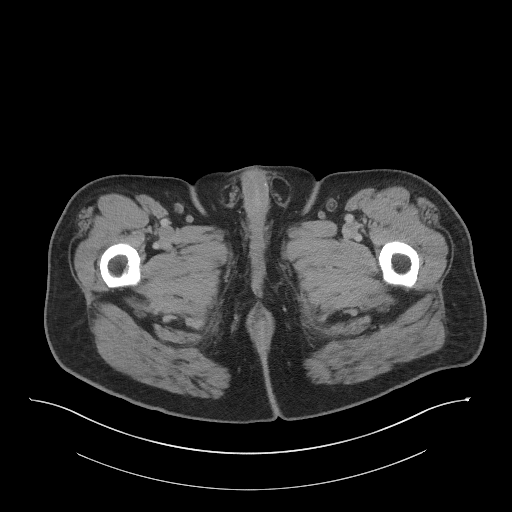
[im 25/119  soft-tissue]
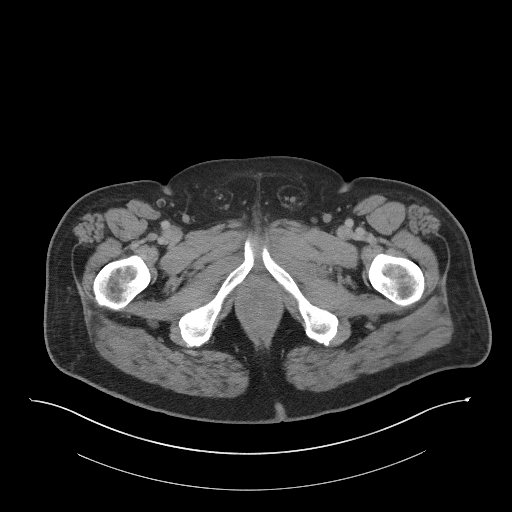
[im 32/119  soft-tissue]
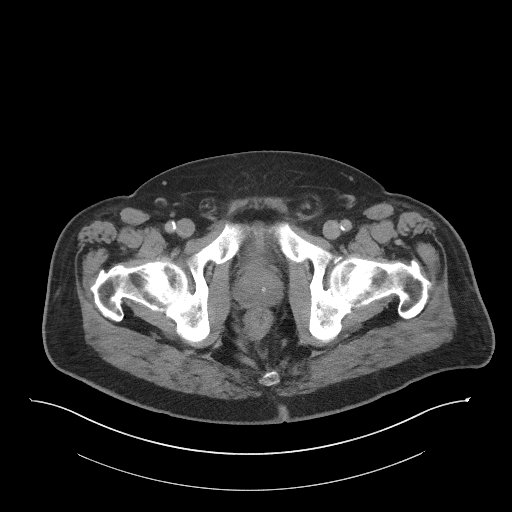
[im 44/119  soft-tissue]
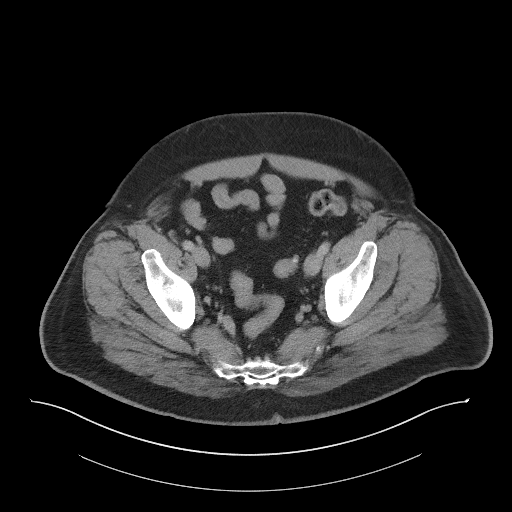
[im 50/119  soft-tissue]
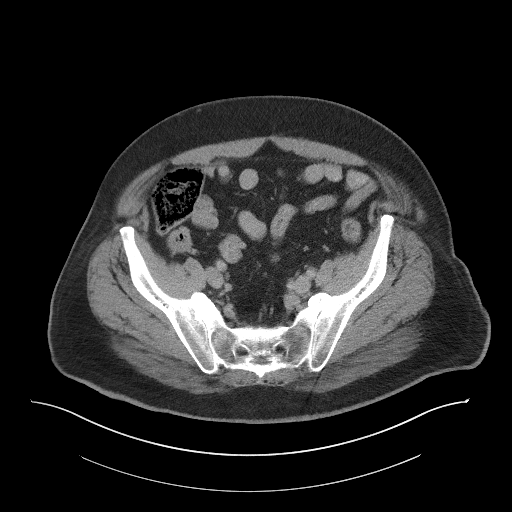
[im 63/119  soft-tissue]
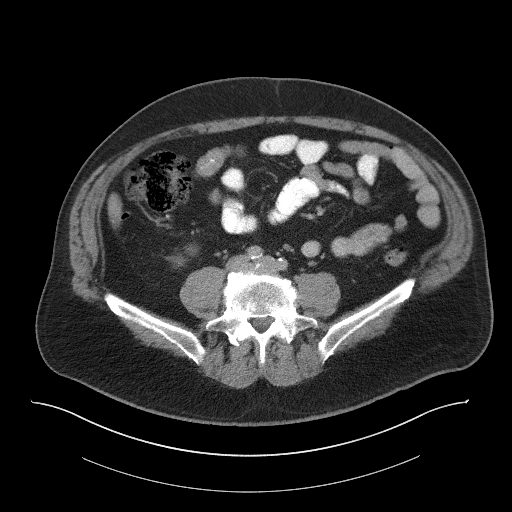
[im 69/119  soft-tissue]
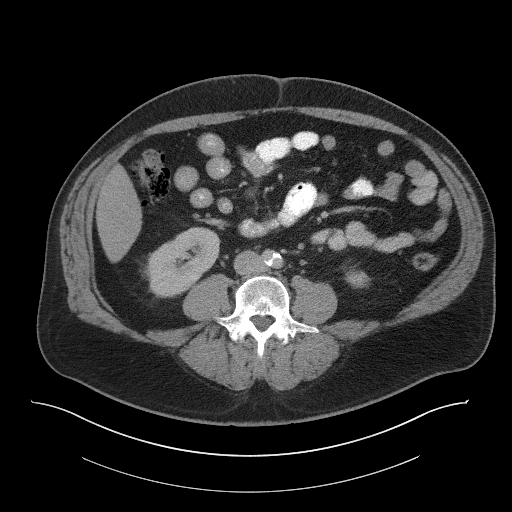
[im 75/119  soft-tissue]
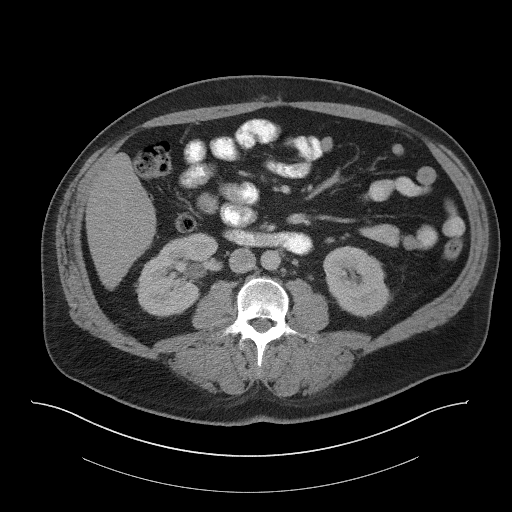
[im 75/119  bone]
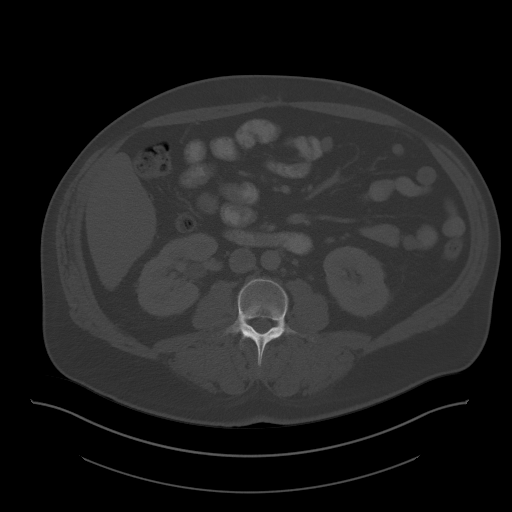
[im 87/119  soft-tissue]
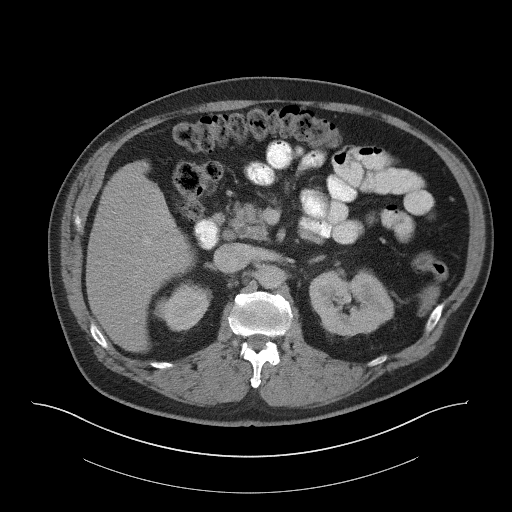
[im 94/119  soft-tissue]
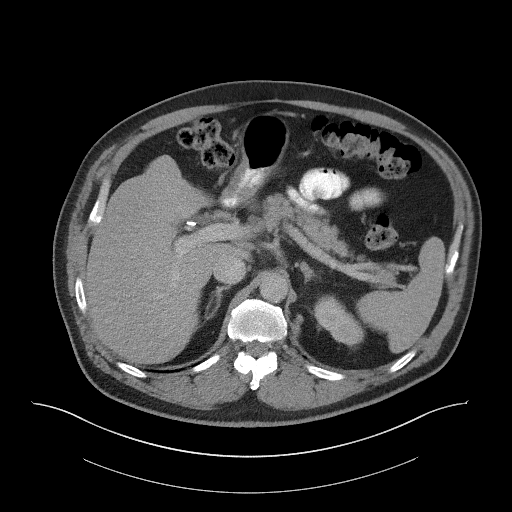
[im 100/119  soft-tissue]
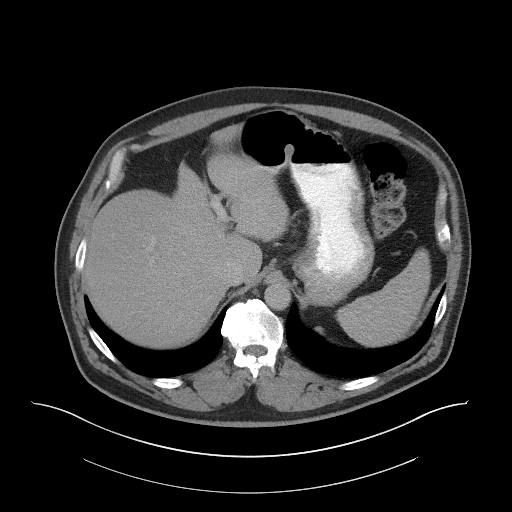
[im 112/119  soft-tissue]
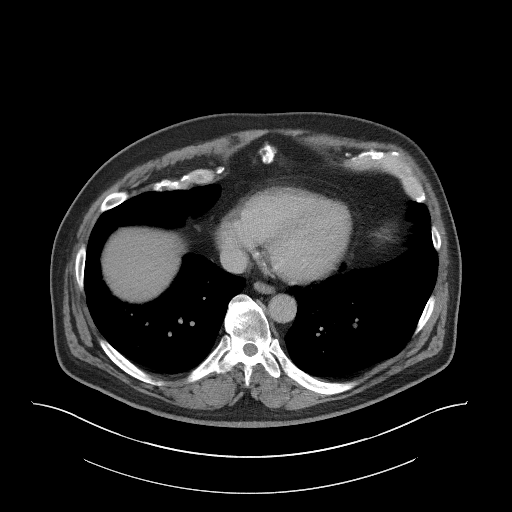

[Series 4: coronal st · coronal · 0.91mm/px · 3 of 109 slices shown]
[im 37/109  soft-tissue]
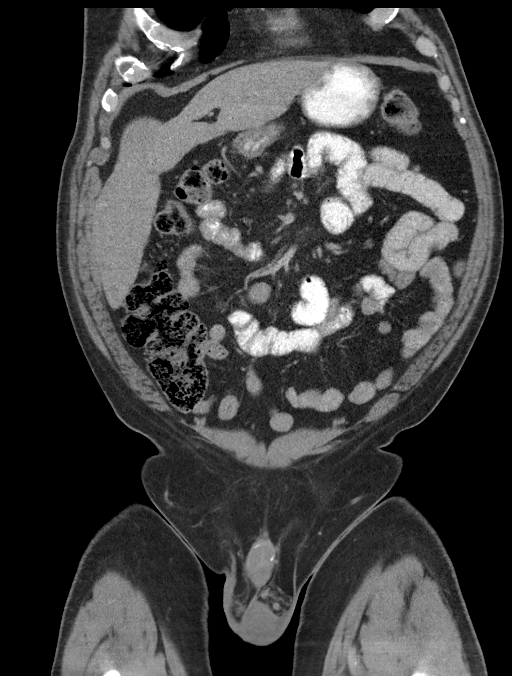
[im 49/109  soft-tissue]
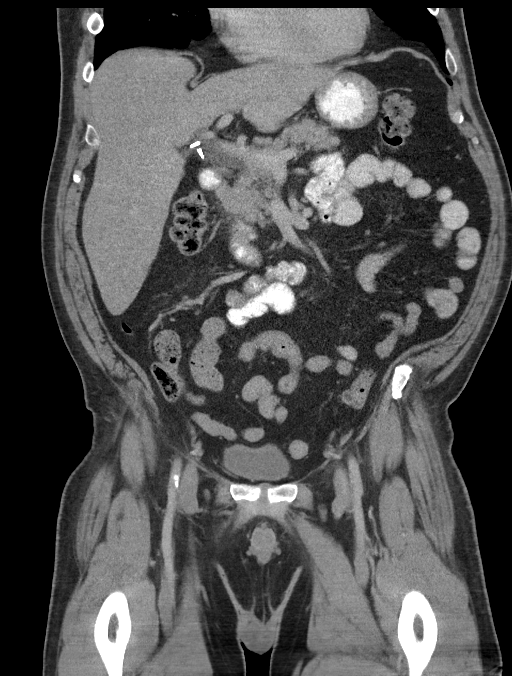
[im 61/109  soft-tissue]
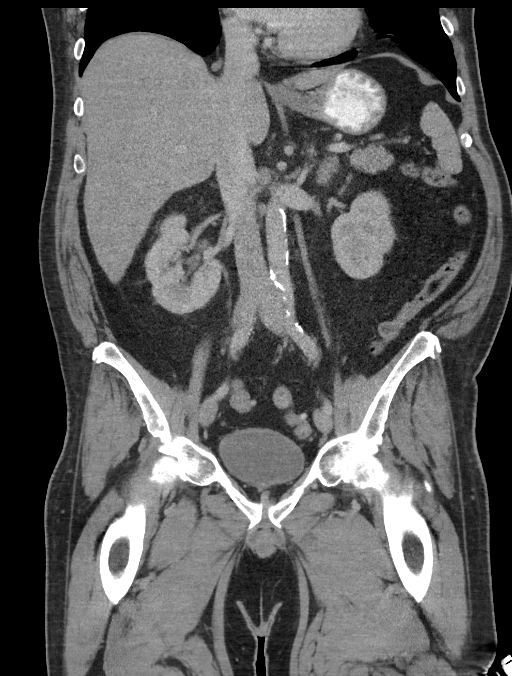

[16 of 46 positions shown; findings below may reference images not displayed]

FINDINGS: Lower chest: Lung bases show no acute findings. Heart size normal.
No pericardial or pleural effusion. Distal esophagus is grossly
unremarkable.

Hepatobiliary: 1.7 cm low-attenuation lesion in the dome of the
liver is unchanged and likely benign. Cholecystectomy. No biliary
ductal dilatation.

Pancreas: Negative.

Spleen: Negative.

Adrenals/Urinary Tract: Adrenal glands are unremarkable. Stones are
seen in the right kidney. Subcentimeter low-attenuation lesion in
the left kidney is too small to characterize but statistically, a
cyst is likely. Ureters are decompressed. Bladder is grossly
unremarkable.

Stomach/Bowel: Stomach, small bowel, appendix and colon are
unremarkable. Note is made that stool is seen in the majority of the
colon.

Vascular/Lymphatic: Atherosclerotic calcification of the aorta
without aneurysm. Periportal lymph nodes measure up to 2.0 cm in
short axis, stable. Retroperitoneal lymph nodes are not enlarged by
CT size criteria.

Reproductive: Prostate is visualized.

Other: No free fluid.  Mesenteries and peritoneum are unremarkable.

Musculoskeletal: Degenerative changes in the spine. No worrisome
lytic or sclerotic lesions.
IMPRESSION: 1. No findings to explain the patient's symptoms other than the
possibility of constipation.
2. Right renal stones.
3.  Aortic atherosclerosis ([id number]).
